# Patient Record
Sex: Male | Born: 1985 | Smoking: Never smoker
Health system: Southern US, Community
[De-identification: ages and names within clinical notes are randomized; demographics above are authoritative.]

---

## 2016-02-28 DIAGNOSIS — M722 Plantar fascial fibromatosis: Secondary | ICD-10-CM | POA: Insufficient documentation

## 2019-07-10 ENCOUNTER — Ambulatory Visit: Payer: Self-pay | Admitting: Podiatry

## 2019-07-11 ENCOUNTER — Other Ambulatory Visit: Payer: Self-pay | Admitting: Podiatry

## 2019-07-11 ENCOUNTER — Ambulatory Visit (INDEPENDENT_AMBULATORY_CARE_PROVIDER_SITE_OTHER): Payer: BC Managed Care – PPO

## 2019-07-11 ENCOUNTER — Other Ambulatory Visit: Payer: Self-pay

## 2019-07-11 ENCOUNTER — Encounter: Payer: Self-pay | Admitting: Podiatry

## 2019-07-11 ENCOUNTER — Ambulatory Visit (INDEPENDENT_AMBULATORY_CARE_PROVIDER_SITE_OTHER): Payer: BC Managed Care – PPO | Admitting: Podiatry

## 2019-07-11 VITALS — Temp 97.1°F | Resp 16

## 2019-07-11 DIAGNOSIS — M79672 Pain in left foot: Secondary | ICD-10-CM

## 2019-07-11 DIAGNOSIS — M79671 Pain in right foot: Secondary | ICD-10-CM

## 2019-07-11 DIAGNOSIS — M216X1 Other acquired deformities of right foot: Secondary | ICD-10-CM

## 2019-07-11 DIAGNOSIS — M216X2 Other acquired deformities of left foot: Secondary | ICD-10-CM

## 2019-07-11 DIAGNOSIS — M722 Plantar fascial fibromatosis: Secondary | ICD-10-CM

## 2019-07-11 MED ORDER — MELOXICAM 15 MG PO TABS: 15.0000 mg | ORAL_TABLET | Freq: Every day | ORAL | 0 refills | Status: DC

## 2019-07-11 NOTE — Progress Notes (Signed)
  Subjective:  Patient ID: Manuel Hogan, male    DOB: [birth year],  MRN: [id number]  Chief Complaint  Patient presents with  . Foot Pain    BL bottom heel pain x couple years; 8/10 pain -pt denies injury -pt states he has been dx with PF few yrs ago -pain worse on concrete floor -pt denies swelling/redness Tx: foot roller and IBU    33 y.o. male presents with the above complaint. Hx as above.  Review of Systems: Negative except as noted in the HPI. Denies N/V/F/Ch.  No past medical history on file.  Current Outpatient Medications:  .  buPROPion (WELLBUTRIN XL) 300 MG 24 hr tablet, Take 300 mg by mouth daily., Disp: , Rfl:  .  escitalopram (LEXAPRO) 20 MG tablet, Take 20 mg by mouth daily., Disp: , Rfl:  .  meloxicam (MOBIC) 15 MG tablet, Take 1 tablet (15 mg total) by mouth daily., Disp: 30 tablet, Rfl: 0 .  olmesartan-hydrochlorothiazide (BENICAR HCT) 40-25 MG tablet, Take by mouth., Disp: , Rfl:  .  phentermine (ADIPEX-P) 37.5 MG tablet, Take 37.5 mg by mouth daily., Disp: , Rfl:   Social History   Tobacco Use  Smoking Status Never Smoker  Smokeless Tobacco Never Used    Allergies  Allergen Reactions  . Penicillin G Other (See Comments)    States, "allergy as a child, has not been exposed since, not sure of reaction but think it is hives"   Objective:   Vitals:   07/11/19 1556  Resp: 16  Temp: (!) 97.1 F (36.2 C)   There is no height or weight on file to calculate BMI. Constitutional Well developed. Well nourished.  Vascular Dorsalis pedis pulses palpable bilaterally. Posterior tibial pulses palpable bilaterally. Capillary refill normal to all digits.  No cyanosis or clubbing noted. Pedal hair growth normal.  Neurologic Normal speech. Oriented to person, place, and time. Epicritic sensation to light touch grossly present bilaterally.  Dermatologic Nails well groomed and normal in appearance. No open wounds. No skin lesions.  Orthopedic: Normal joint ROM  without pain or crepitus bilaterally. No visible deformities. Tender to palpation at the calcaneal tuber bilaterally. No pain with calcaneal squeeze bilaterally. Ankle ROM diminished range of motion bilaterally. Silfverskiold Test: positive bilaterally.   Radiographs: Taken and reviewed. No acute fractures or dislocations. No evidence of stress fracture.  Plantar heel spur absent. Posterior heel spur absent.   Assessment:   1. Plantar fasciitis   2. Pain of both heels   3. Acquired equinus deformity of both feet    Plan:  Patient was evaluated and treated and all questions answered.  Plantar Fasciitis, bilaterally - XR reviewed as above.  - Educated on icing and stretching. Instructions given.  - DME: PF brace dispensed x2 - Pharmacologic management: Meloxicam. Educated on risks/benefits and proper taking of medication.   Return in about 6 weeks (around 08/22/2019).

## 2019-07-11 NOTE — Patient Instructions (Signed)

## 2019-07-24 ENCOUNTER — Other Ambulatory Visit: Payer: Self-pay | Admitting: Podiatry

## 2019-07-24 DIAGNOSIS — M79671 Pain in right foot: Secondary | ICD-10-CM

## 2019-07-24 DIAGNOSIS — M722 Plantar fascial fibromatosis: Secondary | ICD-10-CM

## 2019-08-22 ENCOUNTER — Ambulatory Visit: Payer: BC Managed Care – PPO | Admitting: Podiatry

## 2021-07-10 ENCOUNTER — Other Ambulatory Visit: Payer: Self-pay | Admitting: Physician Assistant

## 2021-07-10 DIAGNOSIS — M76821 Posterior tibial tendinitis, right leg: Secondary | ICD-10-CM

## 2021-07-21 ENCOUNTER — Ambulatory Visit
Admission: RE | Admit: 2021-07-21 | Discharge: 2021-07-21 | Disposition: A | Discharge status: Home / Self Care | Payer: Self-pay | Source: Ambulatory Visit | Attending: Physician Assistant | Admitting: Physician Assistant

## 2021-07-21 ENCOUNTER — Other Ambulatory Visit: Payer: Self-pay

## 2021-07-21 DIAGNOSIS — M76821 Posterior tibial tendinitis, right leg: Secondary | ICD-10-CM

## 2022-01-07 ENCOUNTER — Ambulatory Visit: Payer: No Typology Code available for payment source | Admitting: Podiatry

## 2022-01-07 ENCOUNTER — Encounter: Payer: Self-pay | Admitting: Podiatry

## 2022-01-07 ENCOUNTER — Ambulatory Visit (INDEPENDENT_AMBULATORY_CARE_PROVIDER_SITE_OTHER): Payer: No Typology Code available for payment source | Admitting: Podiatry

## 2022-01-07 ENCOUNTER — Other Ambulatory Visit: Payer: Self-pay

## 2022-01-07 ENCOUNTER — Ambulatory Visit (INDEPENDENT_AMBULATORY_CARE_PROVIDER_SITE_OTHER): Payer: Self-pay

## 2022-01-07 DIAGNOSIS — M76821 Posterior tibial tendinitis, right leg: Secondary | ICD-10-CM

## 2022-01-07 DIAGNOSIS — M25579 Pain in unspecified ankle and joints of unspecified foot: Secondary | ICD-10-CM

## 2022-01-07 DIAGNOSIS — G5791 Unspecified mononeuropathy of right lower limb: Secondary | ICD-10-CM | POA: Diagnosis not present

## 2022-01-07 NOTE — Progress Notes (Signed)
°  Subjective:  Patient ID: Manuel Hogan, male    DOB: [birth year],   MRN: [id number]  Chief Complaint  Patient presents with   Foot Pain   Foot Injury    Pt came in today with ankle which started in may of last year. Pt had a forklift run over his ankle at work. Still has ankle pain which he rates a 8 out of 10.    36 y.o. male presents for right ankle injury that occurred last May (03/27/21) Relates at that time he was hit by a forklift. Had immediate sharp shooting pain. At the time he had negative X-rays. 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 States it can get to an 8/10. He has tried PT and pain meds. Does have an MRI of the right ankle from August I reviewed. Denies any other pedal complaints. Denies n/v/f/c.   No past medical history on file.  Objective:  Physical Exam: Vascular: DP/PT pulses 2/4 bilateral. CFT <3 seconds. Normal hair growth on digits. No edema.  Skin. No lacerations or abrasions bilateral feet.  Musculoskeletal: MMT 5/5 bilateral lower extremities in DF, PF, Inversion and Eversion. Deceased ROM in DF of ankle joint.  Relates tenderness along PT tendon and up medial calf and posterior to medial mallolus. No pain laterally. Does relates burning and tingling in the medial ankle area.  Neurological: Sensation intact to light touch. Negative tinel.   MRI right ankle (07/22/21):  Shows no ligament or tendinous damage, some bone marrow edema to the distal tibia. No other acute or chronic findings.    Assessment:   1. Neuritis of right ankle   2. Posterior tibial tendon dysfunction (PTTD) of right lower extremity      Plan:  Patient was evaluated and treated and all questions answered. X-rays reviewed and discussed with patient. No acute fractures or dislocations noted.  Discussed neuritis vs neuropathy vs PTTD  and etiology as well as treatment with patient.  Radiographs reviewed and discussed with patient.  -Discussed supportive shoes at all  times  -Dispensed Tri-Lock ankle brace to aid with tendon pain.  -NCV studies ordered.   -Patient to return after NCV     Louann Sjogren, DPM

## 2022-03-02 ENCOUNTER — Encounter: Payer: Self-pay | Admitting: Podiatry

## 2022-03-27 ENCOUNTER — Ambulatory Visit: Payer: No Typology Code available for payment source | Admitting: Podiatry

## 2022-03-27 ENCOUNTER — Ambulatory Visit (INDEPENDENT_AMBULATORY_CARE_PROVIDER_SITE_OTHER): Payer: No Typology Code available for payment source | Admitting: Podiatry

## 2022-03-27 ENCOUNTER — Encounter: Payer: Self-pay | Admitting: Podiatry

## 2022-03-27 DIAGNOSIS — G5791 Unspecified mononeuropathy of right lower limb: Secondary | ICD-10-CM

## 2022-03-27 DIAGNOSIS — G5771 Causalgia of right lower limb: Secondary | ICD-10-CM

## 2022-03-27 MED ORDER — GABAPENTIN 100 MG PO CAPS
100.0000 mg | ORAL_CAPSULE | Freq: Three times a day (TID) | ORAL | 3 refills | Status: AC
Start: 2022-03-27 — End: ?

## 2022-03-27 NOTE — Progress Notes (Signed)
?  Subjective:  ?Patient ID: Manuel Hogan, male    DOB: [birth year],   MRN: [id number] ? ?Chief Complaint  ?Patient presents with  ? Ankle Pain  ?  Follow up neuritis ankle right   "Its really the same, no change at all"  ? ? ?36 y.o. male presents for follow-up of right leg and ankle injury that occurred 5/5//22. Relates pain is doing the same . Had his NCV and here to discuss results.  Denies any other pedal complaints. Denies n/v/f/c.  ? ?No past medical history on file. ? ?Objective:  ?Physical Exam: ?Vascular: DP/PT pulses 2/4 bilateral. CFT <3 seconds. Normal hair growth on digits. No edema.  ?Skin. No lacerations or abrasions bilateral feet.  ?Musculoskeletal: MMT 5/5 bilateral lower extremities in DF, PF, Inversion and Eversion. Deceased ROM in DF of ankle joint.  Relates tenderness along PT tendon and up medial calf and posterior to medial mallolus. No pain laterally. Does relates burning and tingling in the medial ankle area.  ?Neurological: Sensation intact to light touch. Negative tinel.  ? ?MRI right ankle (07/22/21):  ?Shows no ligament or tendinous damage, some bone marrow edema to the distal tibia. No other acute or chronic findings.  ? ? ?Assessment:  ? ?1. Neuritis of right ankle   ?2. Complex regional pain syndrome type 2 of right lower extremity   ? ? ? ?Plan:  ?Patient was evaluated and treated and all questions answered. ?X-rays reviewed and discussed with patient. No acute fractures or dislocations noted.  ?Discussed neuritis vs neuropathy vs PTTD  and etiology as well as treatment with patient.  ?Appears the majority of the pain is nerve relates as it is occurring proximally along leg from ankle.  ?Radiographs reviewed and discussed with patient.  ?-Discussed supportive shoes at all times  ?-Continue bracing as needed.  ?-NCV studies ordered. Patient had done on 4/10 unable to see results  ?-Discussed creams to try for nerve pain.  ?-Gabapentin provided to aid with nerve pain.  ?-Will go ahead  and refer to neurology for possible CRPS vs other neuritis.   ? ? ? ?Louann Sjogren, DPM  ? ? ?

## 2022-04-09 ENCOUNTER — Other Ambulatory Visit: Payer: Self-pay | Admitting: Podiatry

## 2022-04-09 DIAGNOSIS — M25579 Pain in unspecified ankle and joints of unspecified foot: Secondary | ICD-10-CM

## 2022-04-09 DIAGNOSIS — G5791 Unspecified mononeuropathy of right lower limb: Secondary | ICD-10-CM

## 2022-04-09 DIAGNOSIS — G5771 Causalgia of right lower limb: Secondary | ICD-10-CM

## 2022-05-15 IMAGING — MR MR ANKLE*R* W/O CM
4 of 5 series · 13 of 40 positions shown · non-contrast
Comparison: X-ray 03/27/2021

CLINICAL DATA: Right ankle pain after injury 3.5 months ago

EXAM:
MRI OF THE RIGHT ANKLE WITHOUT CONTRAST
TECHNIQUE: Multiplanar, multisequence MR imaging of the ankle was performed. No
intravenous contrast was administered.

[Series 3: PD fat-sat · axial · right · 3.0mm · 0.25mm/px · z∈[-108,-8]mm · 4 of 30 slices shown]
[im 1/30]
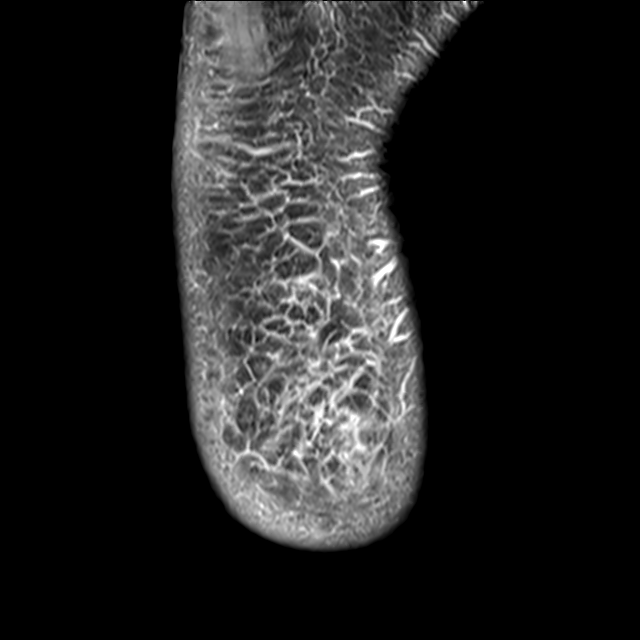
[im 4/30]
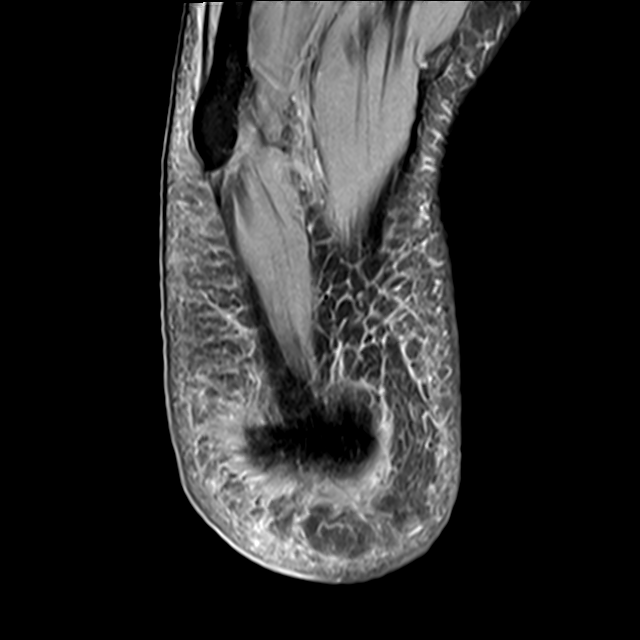
[im 15/30]
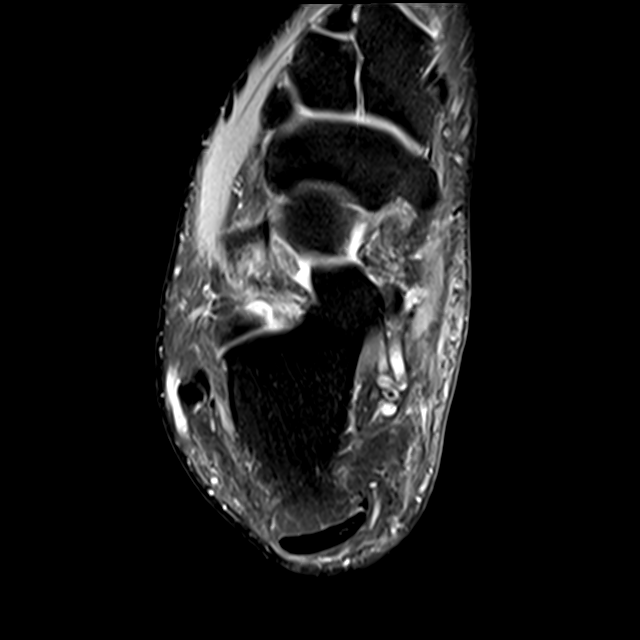
[im 26/30]
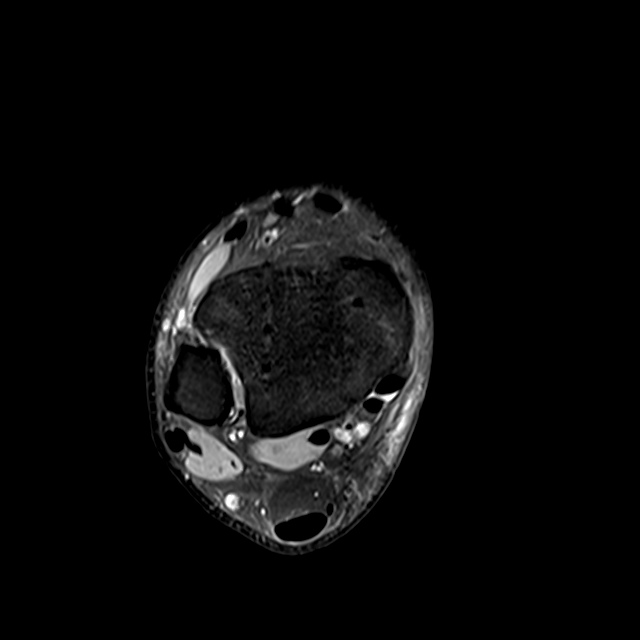

[Series 4: T2 fat-sat · axial · right · 3.0mm · 0.25mm/px · z∈[-96,-8]mm · 3 of 30 slices shown (1 of 2)]
[im 4/30]
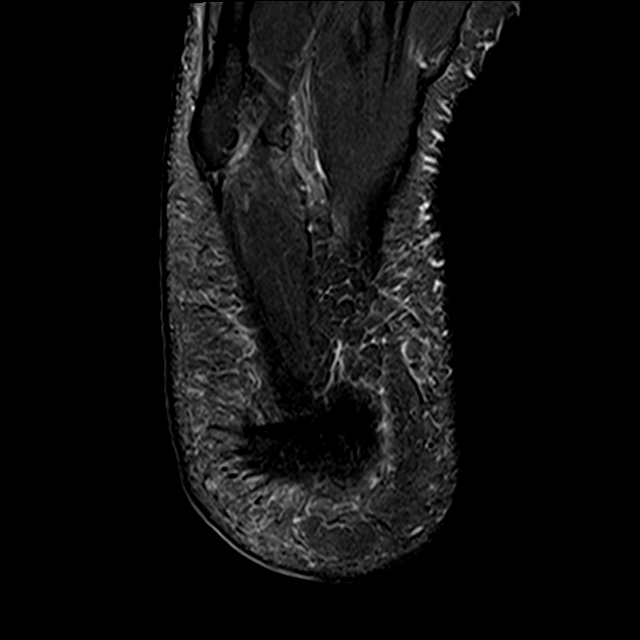
[im 15/30]
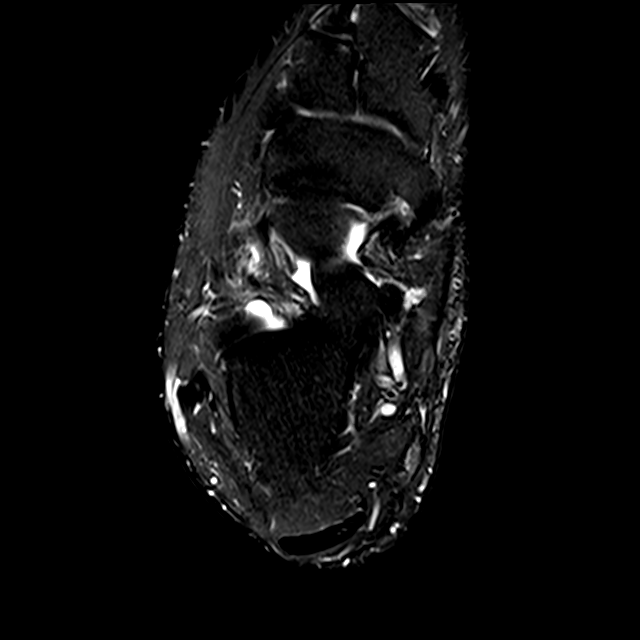
[im 26/30]
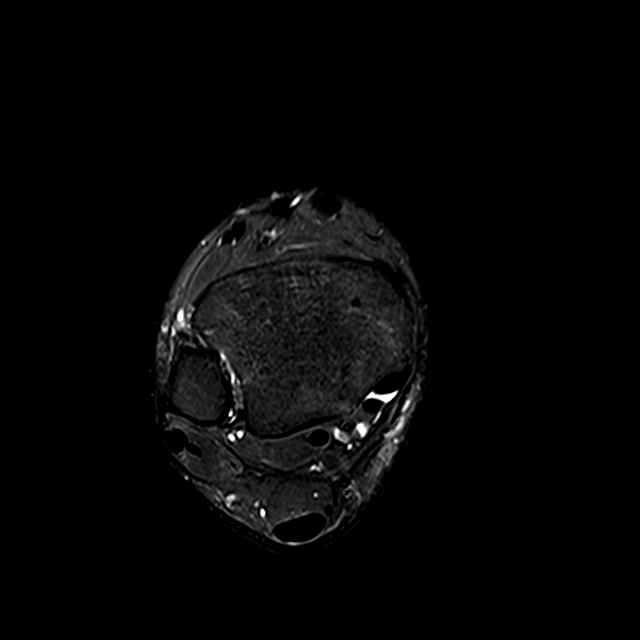

[Series 5: T1 · sagittal · right · 4.0mm · 0.27mm/px · 3 of 21 slices shown]
[im 5/21]
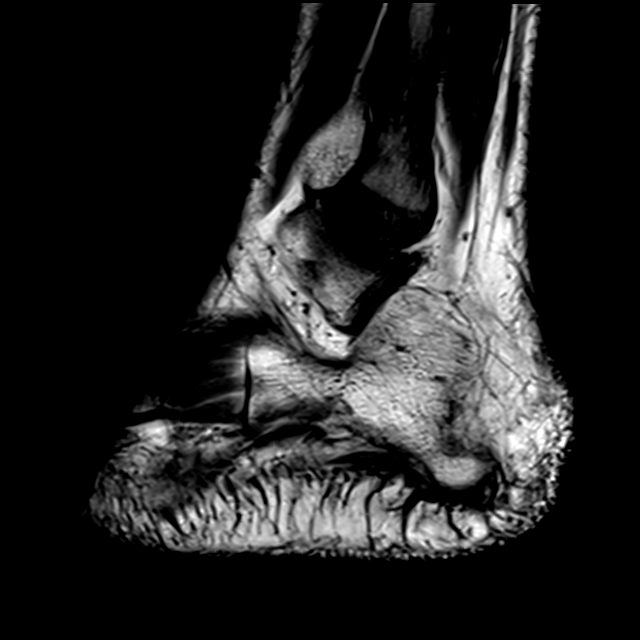
[im 13/21]
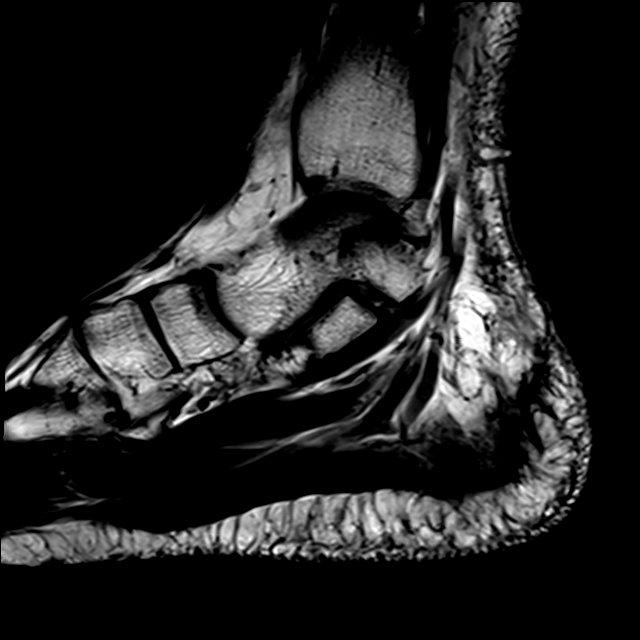
[im 21/21]
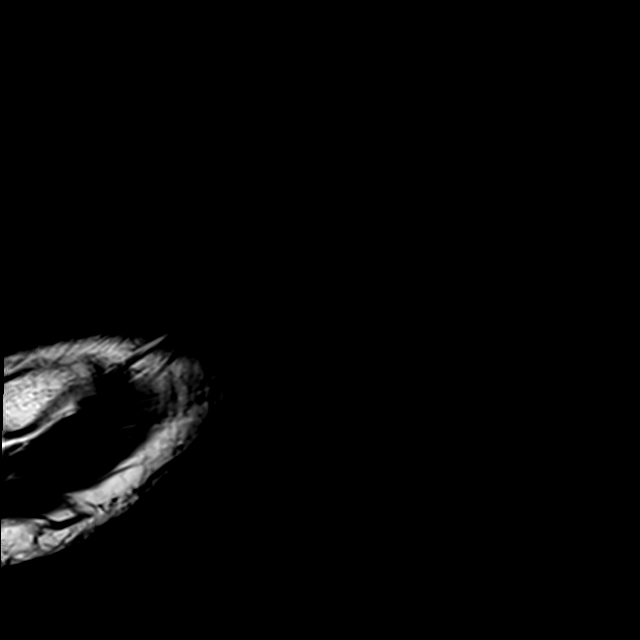

[Series 7: T2 fat-sat · coronal · right · 3.0mm · 0.25mm/px · 3 of 33 slices shown (2 of 2)]
[im 4/33]
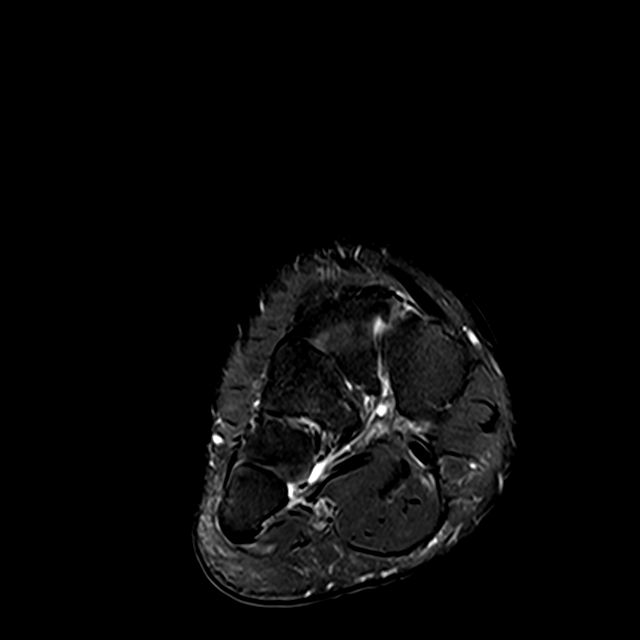
[im 18/33]
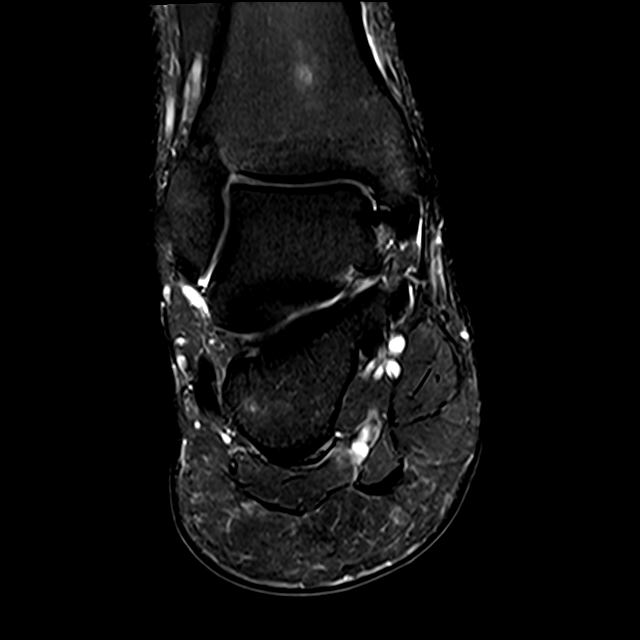
[im 29/33]
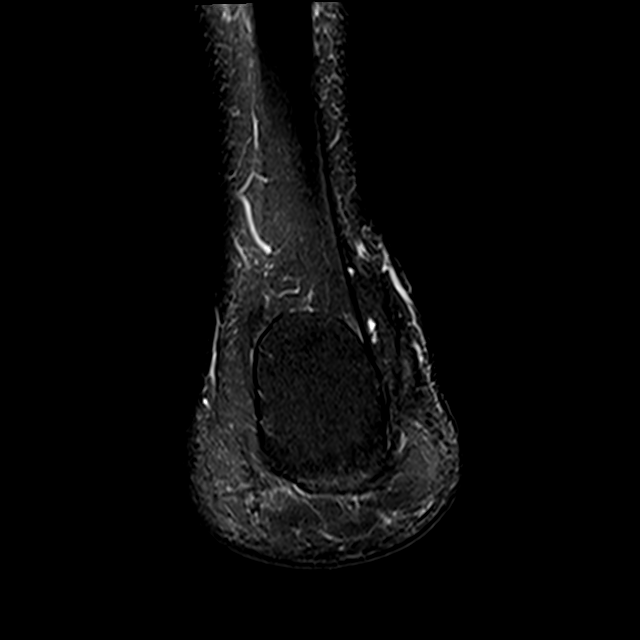

[13 of 40 positions shown; findings below may reference images not displayed]

FINDINGS: TENDONS

Peroneal: Intact peroneus longus and peroneus brevis tendons.

Posteromedial: Intact tibialis posterior, flexor hallucis longus and
flexor digitorum longus tendons.

Anterior: Intact tibialis anterior, extensor hallucis longus and
extensor digitorum longus tendons.

Achilles: Intact.

Plantar Fascia: Intact.

LIGAMENTS

Lateral: The anterior and posterior tibiofibular ligaments are
intact. The anterior and posterior talofibular ligaments are intact.
Intact calcaneofibular ligament.

Medial: Deltoid ligament and spring ligament complex intact.

CARTILAGE

Ankle Joint: No joint effusion or chondral defect.

Subtalar Joints/Sinus Tarsi: No joint effusion or chondral defect.
Preservation of the anatomic fat within the sinus tarsi.

Bones: Mild bone marrow edema within the distal tibial metaphysis.
No fracture. Remaining visualized marrow signal is within normal
limits. No significant arthropathy. No suspicious bone lesion.

Other: Mild subcutaneous edema at the posteromedial aspect of the
ankle. No fluid collection.
IMPRESSION: 1. Mild bone marrow edema within the distal tibial metaphysis, which
may reflect a mild bone contusion. No fracture.
2. Mild subcutaneous edema at the posteromedial aspect of the ankle.
3. No evidence of internal derangement of the right ankle.

## 2022-05-20 ENCOUNTER — Telehealth: Payer: Self-pay | Admitting: Podiatry

## 2022-05-20 ENCOUNTER — Other Ambulatory Visit: Payer: Self-pay | Admitting: Podiatry

## 2022-05-20 DIAGNOSIS — G5771 Causalgia of right lower limb: Secondary | ICD-10-CM

## 2022-05-20 DIAGNOSIS — G5791 Unspecified mononeuropathy of right lower limb: Secondary | ICD-10-CM

## 2022-05-20 NOTE — Telephone Encounter (Signed)
Pt was referred by you to neurology but they feel his issue needs to be addressed by a back specialist.  Pt is wanting to know if you can refer him.

## 2022-05-20 NOTE — Telephone Encounter (Signed)
Left msg to notify pt/reb

## 2022-05-20 NOTE — Telephone Encounter (Signed)
Pt was referred by you to neurology

## 2024-02-16 ENCOUNTER — Other Ambulatory Visit: Payer: Self-pay | Admitting: Medical Genetics

## 2024-03-01 ENCOUNTER — Other Ambulatory Visit

## 2024-09-01 ENCOUNTER — Other Ambulatory Visit: Payer: Self-pay | Admitting: Medical Genetics

## 2024-09-01 DIAGNOSIS — Z006 Encounter for examination for normal comparison and control in clinical research program: Secondary | ICD-10-CM
